# Patient Record
Sex: Male | Born: 1960 | Race: White | Hispanic: No | Marital: Married | Smoking: Never smoker
Health system: Southern US, Community
[De-identification: ages and names within clinical notes are randomized; demographics above are authoritative.]

## PROBLEM LIST (undated history)

## (undated) DIAGNOSIS — F4310 Post-traumatic stress disorder, unspecified: Secondary | ICD-10-CM

## (undated) DIAGNOSIS — Z87442 Personal history of urinary calculi: Secondary | ICD-10-CM

## (undated) DIAGNOSIS — R519 Headache, unspecified: Secondary | ICD-10-CM

## (undated) DIAGNOSIS — R011 Cardiac murmur, unspecified: Secondary | ICD-10-CM

## (undated) DIAGNOSIS — K219 Gastro-esophageal reflux disease without esophagitis: Secondary | ICD-10-CM

## (undated) DIAGNOSIS — R51 Headache: Secondary | ICD-10-CM

## (undated) HISTORY — PX: CLEFT PALATE REPAIR: SUR1165

## (undated) HISTORY — PX: HERNIA REPAIR: SHX51

## (undated) HISTORY — PX: KNEE ARTHROSCOPY: SUR90

## (undated) HISTORY — PX: CHOLECYSTECTOMY: SHX55

---

## 2010-11-09 ENCOUNTER — Ambulatory Visit (HOSPITAL_BASED_OUTPATIENT_CLINIC_OR_DEPARTMENT_OTHER)
Admission: RE | Admit: 2010-11-09 | Discharge: 2010-11-09 | Disposition: A | Discharge status: Home / Self Care | Payer: BC Managed Care – PPO | Source: Ambulatory Visit | Attending: Orthopedic Surgery | Admitting: Orthopedic Surgery

## 2010-11-09 DIAGNOSIS — M23359 Other meniscus derangements, posterior horn of lateral meniscus, unspecified knee: Secondary | ICD-10-CM | POA: Insufficient documentation

## 2010-11-09 DIAGNOSIS — K219 Gastro-esophageal reflux disease without esophagitis: Secondary | ICD-10-CM | POA: Insufficient documentation

## 2010-11-09 DIAGNOSIS — M2240 Chondromalacia patellae, unspecified knee: Secondary | ICD-10-CM | POA: Insufficient documentation

## 2010-11-09 DIAGNOSIS — M23329 Other meniscus derangements, posterior horn of medial meniscus, unspecified knee: Secondary | ICD-10-CM | POA: Insufficient documentation

## 2010-11-09 DIAGNOSIS — Z01812 Encounter for preprocedural laboratory examination: Secondary | ICD-10-CM | POA: Insufficient documentation

## 2010-11-09 LAB — POCT HEMOGLOBIN-HEMACUE: Hemoglobin: 17.1 g/dL — ABNORMAL HIGH (ref 13.0–17.0)

## 2010-11-10 NOTE — Op Note (Signed)
Benjamin Kirby, Benjamin Kirby NO.:  [id number]  MEDICAL RECORD NO.:  [id number]  LOCATION:                                 FACILITY:  PHYSICIAN:  Feliberto Gottron. Turner Daniels, M.D.        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  PROCEDURE:  Right knee partial arthroscopic medial and lateral meniscectomies, debridement of chondromalacia.  SURGEON:  Feliberto Gottron. Turner Daniels, MD  FIRST ASSISTANT:  None.  ANESTHETIC:  General LMA.  ESTIMATED BLOOD LOSS:  Minimal.  FLUID REPLACEMENT:  800 mL crystalloid.  DRAINS PLACED:  None.  TOURNIQUET TIME:  None.  INDICATIONS FOR PROCEDURE:  A 50 year old male with catching, popping and pain in his right knee.  He has failed conservative treatment, anti- inflammatory medicines, physical therapy, observation, cortisone injections and has an MRI scan showing intra-articular pathology.  He desires elective right knee arthroscopic evaluation treatment.  Risks and benefits of surgery discussed, questions answered.  DESCRIPTION OF PROCEDURE:  The patient was identified by armband and taken to the operating room 5 of the [facility] where the appropriate site monitors were attached and general LMA anesthesia induced with the patient in supine position.  Prior to going back to the operating room, he did have an intra-articular local anesthetic block placed.   After the successful induction of anesthesia, lateral post was applied to the table and the patient was then prepped and draped in the usual sterile fashion from the ankle to the midthigh.  Time-out procedure was performed and we began the operation itself by making standard inferomedial and anterolateral peripatellar portals allowing introduction of the arthroscope inferolateral portal outflow through the inferomedial portal pump pressure was set at a 100 mm.  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  This was debrided back to stable margin with a 3.5 gator sucker shaver.  The trial itself was clean as was the distal aspect of the medial and lateral femoral condyle.  The posterior aspect of the medial femoral condyle did have delamination of the cartilage and this was debrided back to a stable margin, grade 3 chondromalacia.  The posterior horn of the medial meniscus had a parrot-beak tear, this was resected with a small and large straight biters and a 3.5 gator sucker shaver.  The articular cartilage of the medial tibial plateau was in good condition. Moving into the notch, the ACL and PCL were intact.  On the lateral side, the patient had a posterior horn degenerative tearing of the lateral meniscus, debrided back to stable margin and a partial thickness horizontal tear that was also widely debrided.  The posterior aspect of the  lateral tibial plateau did have grade 3 and focal grade 4 chondromalacia.  Abrasion arthroplasty was performed using a 3.5 gator sucker shaver.  We also encountered some small cartilaginous loose bodies that were removed at this time as well.  The gutters were cleared medially and laterally.  The knee was irrigated out normal saline solution.  Arthroscopic instrument was removed.  Dressing of Xeroform, 4x4 dressing sponges, Webril and Ace wrap applied.  The patient was awakened, extubated and taken to the recovery room  without difficulty.     Feliberto Gottron. Turner Daniels, M.D.     Ovid Curd  D:  11/09/2010  T:  11/10/2010  Job:  [id number]  Electronically Signed by Gean Birchwood M.D. on 11/10/2010 10:44:57 PM

## 2011-06-07 ENCOUNTER — Ambulatory Visit (INDEPENDENT_AMBULATORY_CARE_PROVIDER_SITE_OTHER): Payer: Federal, State, Local not specified - PPO | Admitting: Family Medicine

## 2011-06-07 DIAGNOSIS — J209 Acute bronchitis, unspecified: Secondary | ICD-10-CM

## 2011-06-07 DIAGNOSIS — I4510 Unspecified right bundle-branch block: Secondary | ICD-10-CM

## 2011-06-07 DIAGNOSIS — J40 Bronchitis, not specified as acute or chronic: Secondary | ICD-10-CM

## 2011-06-07 DIAGNOSIS — Z0000 Encounter for general adult medical examination without abnormal findings: Secondary | ICD-10-CM

## 2011-06-07 DIAGNOSIS — J019 Acute sinusitis, unspecified: Secondary | ICD-10-CM

## 2011-06-07 MED ORDER — HYDROCODONE-HOMATROPINE 5-1.5 MG/5ML PO SYRP: 5.0000 mL | ORAL_SOLUTION | Freq: Four times a day (QID) | ORAL | Status: AC | PRN

## 2011-06-07 MED ORDER — LEVOFLOXACIN 500 MG PO TABS: 500.0000 mg | ORAL_TABLET | Freq: Every day | ORAL | Status: AC

## 2011-06-07 NOTE — Progress Notes (Signed)
Mr. Jon Billings is a 51 year old gentleman who works as a Curator in [employer]. He is currently suffering from a cough for 10 days along with sinus congestion and blocked ears. He's not had any fever nor significant shortness of breath.  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  Objective: HEENT is negative except for status post cleft palate repair scar, copious mucopurulent discharge from the nose, badly scarred TMs worse on the left. Oropharynx is clear. Neck shows no adenopathy or thyromegaly. He's in no acute distress his neck is supple. Chest reveals bilateral faint wheezing.  Spent 1/2 hour face to face reviewing PMHx  Assessment: Bronchitis and sinus sinusitis, acute. Duration has been over 10 days. Symptoms are worsening. Plan:  Plan: Levaquin and Hydromet.  If no better, patient may need a course of steroids.

## 2011-07-13 ENCOUNTER — Telehealth: Payer: Self-pay

## 2011-07-13 ENCOUNTER — Encounter: Payer: Self-pay | Admitting: Family Medicine

## 2011-07-13 NOTE — Telephone Encounter (Signed)
Dr L, can you write a letter for this pt to give to his work?

## 2011-07-13 NOTE — Telephone Encounter (Signed)
Notified pt that Dr L wrote the letter he needs for work. He stated he will p/up after work tomorrow.

## 2011-07-13 NOTE — Telephone Encounter (Signed)
I will write the letter.

## 2011-07-13 NOTE — Telephone Encounter (Signed)
Pt states he was seen by dr Milus Glazier in February 2013 and prescribed a cough med with codine in it.  He has now had to take a drug screen for his employer and it is possitive. Pt is requesting a medical note form dr Milus Glazier stating he prescribed the medicine.  Pt needs as soon as possible.  Please contact tact at [phone]

## 2015-04-16 ENCOUNTER — Encounter (HOSPITAL_COMMUNITY): Payer: Self-pay | Admitting: Emergency Medicine

## 2015-04-16 ENCOUNTER — Emergency Department (HOSPITAL_COMMUNITY): Payer: Managed Care, Other (non HMO)

## 2015-04-16 ENCOUNTER — Other Ambulatory Visit: Payer: Self-pay | Admitting: Emergency Medicine

## 2015-04-16 ENCOUNTER — Emergency Department (HOSPITAL_COMMUNITY)
Admission: EM | Admit: 2015-04-16 | Discharge: 2015-04-16 | Disposition: A | Discharge status: Home / Self Care | Payer: Managed Care, Other (non HMO) | Attending: Emergency Medicine | Admitting: Emergency Medicine

## 2015-04-16 DIAGNOSIS — R61 Generalized hyperhidrosis: Secondary | ICD-10-CM | POA: Insufficient documentation

## 2015-04-16 DIAGNOSIS — Z79899 Other long term (current) drug therapy: Secondary | ICD-10-CM | POA: Diagnosis not present

## 2015-04-16 DIAGNOSIS — N201 Calculus of ureter: Secondary | ICD-10-CM | POA: Diagnosis not present

## 2015-04-16 DIAGNOSIS — Z8679 Personal history of other diseases of the circulatory system: Secondary | ICD-10-CM | POA: Diagnosis not present

## 2015-04-16 DIAGNOSIS — R109 Unspecified abdominal pain: Secondary | ICD-10-CM | POA: Diagnosis present

## 2015-04-16 LAB — BASIC METABOLIC PANEL
Anion gap: 9 (ref 5–15)
BUN: 12 mg/dL (ref 6–20)
CO2: 22 mmol/L (ref 22–32)
Calcium: 9 mg/dL (ref 8.9–10.3)
Chloride: 103 mmol/L (ref 101–111)
Creatinine, Ser: 1.08 mg/dL (ref 0.61–1.24)
GFR calc Af Amer: >60 mL/min (ref >60)
GFR calc non Af Amer: >60 mL/min (ref >60)
Glucose, Bld: 128 mg/dL — ABNORMAL HIGH (ref 65–99)
Potassium: 3.7 mmol/L (ref 3.5–5.1)
Sodium: 134 mmol/L — ABNORMAL LOW (ref 135–145)

## 2015-04-16 LAB — URINALYSIS, ROUTINE W REFLEX MICROSCOPIC
Glucose, UA: NEGATIVE mg/dL
Ketones, ur: 15 mg/dL — AB
Nitrite: NEGATIVE
Protein, ur: 30 mg/dL — AB
Specific Gravity, Urine: 1.028 (ref 1.005–1.030)
pH: 5.5 (ref 5.0–8.0)

## 2015-04-16 LAB — CBC
HCT: 44.9 % (ref 39.0–52.0)
Hemoglobin: 16 g/dL (ref 13.0–17.0)
MCH: 29.9 pg (ref 26.0–34.0)
MCHC: 35.6 g/dL (ref 30.0–36.0)
MCV: 83.8 fL (ref 78.0–100.0)
Platelets: 249 10*3/uL (ref 150–400)
RBC: 5.36 MIL/uL (ref 4.22–5.81)
RDW: 12.8 % (ref 11.5–15.5)
WBC: 7.8 10*3/uL (ref 4.0–10.5)

## 2015-04-16 LAB — LIPASE, BLOOD: Lipase: 28 U/L (ref 11–51)

## 2015-04-16 LAB — DIFFERENTIAL
Basophils Absolute: 0.1 10*3/uL (ref 0.0–0.1)
Basophils Relative: 1 %
Eosinophils Absolute: 0.5 10*3/uL (ref 0.0–0.7)
Eosinophils Relative: 6 %
Lymphocytes Relative: 33 %
Lymphs Abs: 2.5 10*3/uL (ref 0.7–4.0)
Monocytes Absolute: 1.1 10*3/uL — ABNORMAL HIGH (ref 0.1–1.0)
Monocytes Relative: 14 %
Neutro Abs: 3.7 10*3/uL (ref 1.7–7.7)
Neutrophils Relative %: 46 %

## 2015-04-16 LAB — URINE MICROSCOPIC-ADD ON

## 2015-04-16 MED ORDER — ONDANSETRON HCL 4 MG/2ML IJ SOLN
4.0000 mg | Freq: Once | INTRAMUSCULAR | Status: AC
  Administered 2015-04-16: 4 mg via INTRAVENOUS
  Filled 2015-04-16: qty 2

## 2015-04-16 MED ORDER — HYDROMORPHONE HCL 1 MG/ML IJ SOLN
1.0000 mg | Freq: Once | INTRAMUSCULAR | Status: AC
  Administered 2015-04-16: 1 mg via INTRAVENOUS
  Filled 2015-04-16: qty 1

## 2015-04-16 MED ORDER — HYDROMORPHONE HCL 1 MG/ML IJ SOLN
0.5000 mg | Freq: Once | INTRAMUSCULAR | Status: AC
  Administered 2015-04-16: 0.5 mg via INTRAVENOUS
  Filled 2015-04-16: qty 1

## 2015-04-16 MED ORDER — ONDANSETRON HCL 4 MG PO TABS: 4.0000 mg | ORAL_TABLET | Freq: Four times a day (QID) | ORAL | Status: DC

## 2015-04-16 MED ORDER — HYDROCODONE-ACETAMINOPHEN 5-325 MG PO TABS: 1.0000 | ORAL_TABLET | ORAL | Status: DC | PRN

## 2015-04-16 MED ORDER — SODIUM CHLORIDE 0.9 % IV BOLUS (SEPSIS)
1000.0000 mL | Freq: Once | INTRAVENOUS | Status: AC
  Administered 2015-04-16: 1000 mL via INTRAVENOUS

## 2015-04-16 MED ORDER — KETOROLAC TROMETHAMINE 15 MG/ML IJ SOLN
15.0000 mg | Freq: Once | INTRAMUSCULAR | Status: AC
  Administered 2015-04-16: 15 mg via INTRAVENOUS
  Filled 2015-04-16: qty 1

## 2015-04-16 NOTE — ED Notes (Signed)
Dr. Kohut at bedside at this time.  

## 2015-04-16 NOTE — ED Notes (Signed)
Patient reports R flank pain which began at approx 2300. Rates pain 10/10. Sharp, constant pain along with nausea/vomiting.

## 2015-04-16 NOTE — Discharge Instructions (Signed)
Kidney Stones °Kidney stones (urolithiasis) are deposits that form inside your kidneys. The intense pain is caused by the stone moving through the urinary tract. When the stone moves, the ureter goes into spasm around the stone. The stone is usually passed in the urine.  °CAUSES  °· A disorder that makes certain neck glands produce too much parathyroid hormone (primary hyperparathyroidism). °· A buildup of uric acid crystals, similar to gout in your joints. °· Narrowing (stricture) of the ureter. °· A kidney obstruction present at birth (congenital obstruction). °· Previous surgery on the kidney or ureters. °· Numerous kidney infections. °SYMPTOMS  °· Feeling sick to your stomach (nauseous). °· Throwing up (vomiting). °· Blood in the urine (hematuria). °· Pain that usually spreads (radiates) to the groin. °· Frequency or urgency of urination. °DIAGNOSIS  °· Taking a history and physical exam. °· Blood or urine tests. °· CT scan. °· Occasionally, an examination of the inside of the urinary bladder (cystoscopy) is performed. °TREATMENT  °· Observation. °· Increasing your fluid intake. °· Extracorporeal shock wave lithotripsy--This is a noninvasive procedure that uses shock waves to break up kidney stones. °· Surgery may be needed if you have severe pain or persistent obstruction. There are various surgical procedures. Most of the procedures are performed with the use of small instruments. Only small incisions are needed to accommodate these instruments, so recovery time is minimized. °The size, location, and chemical composition are all important variables that will determine the proper choice of action for you. Talk to your health care provider to better understand your situation so that you will minimize the risk of injury to yourself and your kidney.  °HOME CARE INSTRUCTIONS  °· Drink enough water and fluids to keep your urine clear or pale yellow. This will help you to pass the stone or stone fragments. °· Strain  all urine through the provided strainer. Keep all particulate matter and stones for your health care provider to see. The stone causing the pain may be as small as a grain of salt. It is very important to use the strainer each and every time you pass your urine. The collection of your stone will allow your health care provider to analyze it and verify that a stone has actually passed. The stone analysis will often identify what you can do to reduce the incidence of recurrences. °· Only take over-the-counter or prescription medicines for pain, discomfort, or fever as directed by your health care provider. °· Keep all follow-up visits as told by your health care provider. This is important. °· Get follow-up X-rays if required. The absence of pain does not always mean that the stone has passed. It may have only stopped moving. If the urine remains completely obstructed, it can cause loss of kidney function or even complete destruction of the kidney. It is your responsibility to make sure X-rays and follow-ups are completed. Ultrasounds of the kidney can show blockages and the status of the kidney. Ultrasounds are not associated with any radiation and can be performed easily in a matter of minutes. °· Make changes to your daily diet as told by your health care provider. You may be told to: °¨ Limit the amount of salt that you eat. °¨ Eat 5 or more servings of fruits and vegetables each day. °¨ Limit the amount of meat, poultry, fish, and eggs that you eat. °· Collect a 24-hour urine sample as told by your health care provider. You may need to collect another urine sample every 6-12   months. °SEEK MEDICAL CARE IF: °· You experience pain that is progressive and unresponsive to any pain medicine you have been prescribed. °SEEK IMMEDIATE MEDICAL CARE IF:  °· Pain cannot be controlled with the prescribed medicine. °· You have a fever or shaking chills. °· The severity or intensity of pain increases over 18 hours and is not  relieved by pain medicine. °· You develop a new onset of abdominal pain. °· You feel faint or pass out. °· You are unable to urinate. °  °This information is not intended to replace advice given to you by your health care provider. Make sure you discuss any questions you have with your health care provider. °  °Document Released: 04/19/2005 Document Revised: 01/08/2015 Document Reviewed: 09/20/2012 °Elsevier Interactive Patient Education ©2016 [employer] ° °

## 2015-04-29 NOTE — ED Provider Notes (Signed)
CSN: [id number]     Arrival date & time 04/16/15  0039 History   None    Chief Complaint  Patient presents with  . Flank Pain     (Consider location/radiation/quality/duration/timing/severity/associated sxs/prior Treatment) HPI   54 year old male with right flank pain. Chewed onset around 11 PM tonight. Pain is been constant since onset. Describes a sharp. Radiates from her right mid back and right flank. No appreciable exacerbating relieving factors. Associated with nausea and vomited once. No fevers or chills. No urinary complaints. Denies past history kidney stones. No history similar type pain. Is status post cholecystectomy.  Past Medical History  Diagnosis Date  . RBBB     nl echo, Dr. Jacinto HalimGanji   Past Surgical History  Procedure Laterality Date  . Cleft palate repair      as child  . Knee arthroscopy      right   No family history on file. Social History  Substance Use Topics  . Smoking status: Never Smoker   . Smokeless tobacco: None  . Alcohol Use: None    Review of Systems  All systems reviewed and negative, other than as noted in HPI.   Allergies  Review of patient's allergies indicates no known allergies.  Home Medications   Prior to Admission medications   Medication Sig Start Date End Date Taking? Authorizing Provider  FLUoxetine (PROZAC) 10 MG capsule Take 10 mg by mouth daily.   Yes Historical Provider, MD  omeprazole (PRILOSEC) 20 MG capsule Take 20 mg by mouth daily.   Yes Historical Provider, MD  traZODone (DESYREL) 50 MG tablet Take 50 mg by mouth at bedtime.   Yes Historical Provider, MD  HYDROcodone-acetaminophen (NORCO/VICODIN) 5-325 MG tablet Take 1-2 tablets by mouth every 4 (four) hours as needed. 04/16/15   Raeford RazorStephen Sinda Leedom, MD  ondansetron (ZOFRAN) 4 MG tablet Take 1 tablet (4 mg total) by mouth every 6 (six) hours. 04/16/15   Raeford RazorStephen Marlee Trentman, MD   BP 128/66 mmHg  Pulse 85  Resp 14  SpO2 88% Physical Exam  Constitutional: He appears  well-developed and well-nourished. No distress.  Sitting up in bed. Diaphoretic and appears uncomfortable.  HENT:  Head: Normocephalic and atraumatic.  Eyes: Conjunctivae are normal. Right eye exhibits no discharge. Left eye exhibits no discharge.  Neck: Neck supple.  Cardiovascular: Normal rate, regular rhythm and normal heart sounds.  Exam reveals no gallop and no friction rub.   No murmur heard. Pulmonary/Chest: Effort normal and breath sounds normal. No respiratory distress.  Abdominal: Soft. He exhibits no distension. There is no tenderness.  Musculoskeletal: He exhibits no edema or tenderness.  Neurological: He is alert.  Skin: Skin is warm. He is diaphoretic.  Psychiatric: He has a normal mood and affect. His behavior is normal. Thought content normal.  Nursing note and vitals reviewed.   ED Course  Procedures (including critical care time) Labs Review Labs Reviewed - No data to display  Imaging Review No results found. I have personally reviewed and evaluated these images and lab results as part of my medical decision-making.   EKG Interpretation None      MDM   Final diagnoses:  Ureteral stone    54 year old male with right flank pain. Imaging significant for right ureteral stone. Symptoms now controlled. I feel appropriate for discharge at this time.    Raeford RazorStephen Docia Klar, MD 04/29/15 [id number]

## 2016-03-11 ENCOUNTER — Encounter (HOSPITAL_COMMUNITY): Payer: Self-pay | Admitting: Emergency Medicine

## 2016-03-11 ENCOUNTER — Emergency Department (HOSPITAL_COMMUNITY)
Admission: EM | Admit: 2016-03-11 | Discharge: 2016-03-11 | Disposition: A | Discharge status: Home / Self Care | Payer: Managed Care, Other (non HMO) | Attending: Emergency Medicine | Admitting: Emergency Medicine

## 2016-03-11 ENCOUNTER — Emergency Department (HOSPITAL_COMMUNITY): Payer: Managed Care, Other (non HMO)

## 2016-03-11 DIAGNOSIS — M542 Cervicalgia: Secondary | ICD-10-CM

## 2016-03-11 DIAGNOSIS — Y92410 Unspecified street and highway as the place of occurrence of the external cause: Secondary | ICD-10-CM | POA: Insufficient documentation

## 2016-03-11 DIAGNOSIS — S199XXA Unspecified injury of neck, initial encounter: Secondary | ICD-10-CM | POA: Insufficient documentation

## 2016-03-11 DIAGNOSIS — Y939 Activity, unspecified: Secondary | ICD-10-CM | POA: Insufficient documentation

## 2016-03-11 DIAGNOSIS — Y999 Unspecified external cause status: Secondary | ICD-10-CM | POA: Insufficient documentation

## 2016-03-11 HISTORY — DX: Post-traumatic stress disorder, unspecified: F43.10
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
## 2016-03-11 NOTE — ED Notes (Signed)
Pt is in stable condition upon d/c and ambulates from ED. 
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
## 2016-03-11 NOTE — Discharge Instructions (Signed)
Please read attached information. If you experience any new or worsening signs or symptoms please return to the emergency room for evaluation. Please follow-up with your primary care provider or specialist as discussed.  °

## 2017-04-25 ENCOUNTER — Other Ambulatory Visit: Payer: Self-pay

## 2017-04-25 ENCOUNTER — Emergency Department (HOSPITAL_COMMUNITY): Admission: EM | Admit: 2017-04-25 | Discharge: 2017-04-25 | Discharge status: Against Medical Advice | Payer: Self-pay

## 2017-04-26 ENCOUNTER — Emergency Department (HOSPITAL_COMMUNITY)
Admission: EM | Admit: 2017-04-26 | Discharge: 2017-04-26 | Disposition: A | Discharge status: Home / Self Care | Payer: 59 | Attending: Emergency Medicine | Admitting: Emergency Medicine

## 2017-04-26 ENCOUNTER — Encounter (HOSPITAL_COMMUNITY): Payer: Self-pay

## 2017-04-26 ENCOUNTER — Emergency Department (HOSPITAL_COMMUNITY): Payer: 59

## 2017-04-26 ENCOUNTER — Other Ambulatory Visit: Payer: Self-pay

## 2017-04-26 DIAGNOSIS — R112 Nausea with vomiting, unspecified: Secondary | ICD-10-CM | POA: Insufficient documentation

## 2017-04-26 DIAGNOSIS — Z79899 Other long term (current) drug therapy: Secondary | ICD-10-CM | POA: Diagnosis not present

## 2017-04-26 DIAGNOSIS — R109 Unspecified abdominal pain: Secondary | ICD-10-CM | POA: Diagnosis present

## 2017-04-26 DIAGNOSIS — N23 Unspecified renal colic: Secondary | ICD-10-CM | POA: Insufficient documentation

## 2017-04-26 LAB — I-STAT CHEM 8, ED
BUN: 21 mg/dL — ABNORMAL HIGH (ref 6–20)
CALCIUM ION: 1.17 mmol/L (ref 1.15–1.40)
CREATININE: 1.4 mg/dL — AB (ref 0.61–1.24)
Chloride: 105 mmol/L (ref 101–111)
Glucose, Bld: 113 mg/dL — ABNORMAL HIGH (ref 65–99)
HCT: 47 % (ref 39.0–52.0)
HEMOGLOBIN: 16 g/dL (ref 13.0–17.0)
Potassium: 4.1 mmol/L (ref 3.5–5.1)
SODIUM: 140 mmol/L (ref 135–145)
TCO2: 22 mmol/L (ref 22–32)

## 2017-04-26 LAB — URINALYSIS, ROUTINE W REFLEX MICROSCOPIC
BILIRUBIN URINE: NEGATIVE
Bacteria, UA: NONE SEEN
Glucose, UA: NEGATIVE mg/dL
Ketones, ur: NEGATIVE mg/dL
LEUKOCYTES UA: NEGATIVE
Nitrite: NEGATIVE
PH: 5 (ref 5.0–8.0)
Protein, ur: NEGATIVE mg/dL
SPECIFIC GRAVITY, URINE: 1.029 (ref 1.005–1.030)
SQUAMOUS EPITHELIAL / LPF: NONE SEEN

## 2017-04-26 MED ORDER — TAMSULOSIN HCL 0.4 MG PO CAPS: 0.4000 mg | ORAL_CAPSULE | Freq: Every day | ORAL | 0 refills | Status: DC

## 2017-04-26 MED ORDER — ONDANSETRON HCL 4 MG PO TABS: 4.0000 mg | ORAL_TABLET | Freq: Four times a day (QID) | ORAL | 0 refills | Status: DC | PRN

## 2017-04-26 MED ORDER — ONDANSETRON HCL 4 MG/2ML IJ SOLN
4.0000 mg | Freq: Once | INTRAMUSCULAR | Status: AC
  Administered 2017-04-26: 4 mg via INTRAVENOUS
  Filled 2017-04-26: qty 2

## 2017-04-26 MED ORDER — KETOROLAC TROMETHAMINE 30 MG/ML IJ SOLN
30.0000 mg | Freq: Once | INTRAMUSCULAR | Status: AC
  Administered 2017-04-26: 30 mg via INTRAVENOUS
  Filled 2017-04-26: qty 1

## 2017-04-26 MED ORDER — HYDROMORPHONE HCL 1 MG/ML IJ SOLN
1.0000 mg | Freq: Once | INTRAMUSCULAR | Status: AC
  Administered 2017-04-26: 1 mg via INTRAVENOUS
  Filled 2017-04-26: qty 1

## 2017-04-26 MED ORDER — SODIUM CHLORIDE 0.9 % IV BOLUS (SEPSIS)
500.0000 mL | Freq: Once | INTRAVENOUS | Status: AC
  Administered 2017-04-26: 500 mL via INTRAVENOUS

## 2017-04-26 MED ORDER — OXYCODONE-ACETAMINOPHEN 5-325 MG PO TABS: 1.0000 | ORAL_TABLET | ORAL | 0 refills | Status: DC | PRN

## 2017-04-26 NOTE — ED Notes (Signed)
Pt aware of need for urine  

## 2017-04-26 NOTE — ED Notes (Signed)
ED Provider at bedside. 

## 2017-04-26 NOTE — ED Triage Notes (Signed)
Pt from home with right flank pain that has been going on for a day. Pt also endorses vomiting when the pain get worse. Pt has history of kidney stones and this feels similar

## 2017-04-26 NOTE — ED Notes (Signed)
Patient transported to Ultrasound 

## 2017-04-26 NOTE — ED Provider Notes (Signed)
[facility]   CSN: [id number] Arrival date & time: 04/26/17  [id number]     History   Chief Complaint Chief Complaint  Patient presents with  . Flank Pain    right    HPI Benjamin Kirby is a 56 y.o. male.  HPI Patient with history of renal stones presents with right-sided flank pain radiating to the right abdomen starting yesterday at 4 PM.  Associated with nausea and multiple episodes of vomiting.  No diarrhea constipation.  Patient does have some hesitancy with urination but denies any gross hematuria.  No fever or chills.  States pain is very similar to previous kidney stone. Past Medical History:  Diagnosis Date  . PTSD (post-traumatic stress disorder)   . RBBB    nl echo, Dr. Jacinto HalimGanji    Patient Active Problem List   Diagnosis Date Noted  . Healthcare maintenance 06/07/2011  . RBBB 06/07/2011    Past Surgical History:  Procedure Laterality Date  . CLEFT PALATE REPAIR     as child  . KNEE ARTHROSCOPY     right       Home Medications    Prior to Admission medications   Medication Sig Start Date End Date Taking? Authorizing Provider  FLUoxetine (PROZAC) 10 MG capsule Take 20 mg by mouth daily.    Yes [provider]  omeprazole (PRILOSEC) 20 MG capsule Take 20 mg by mouth daily.   Yes [provider]  prazosin (MINIPRESS) 2 MG capsule Take 2 mg by mouth at bedtime.   Yes [provider]  SUMAtriptan (IMITREX) 100 MG tablet Take 100 mg by mouth daily as needed for migraine.  04/06/17  Yes [provider]  traZODone (DESYREL) 50 MG tablet Take 50 mg by mouth at bedtime.   Yes [provider]  ondansetron (ZOFRAN) 4 MG tablet Take 1 tablet (4 mg total) by mouth every 6 (six) hours as needed for nausea or vomiting. 04/26/17   Loren RacerYelverton, Kiala Faraj, MD  oxyCODONE-acetaminophen (PERCOCET) 5-325 MG tablet Take 1-2 tablets by mouth every 4 (four) hours as needed for severe pain.  04/26/17   Loren RacerYelverton, Mannie Ohlin, MD  tamsulosin (FLOMAX) 0.4 MG CAPS capsule Take 1 capsule (0.4 mg total) by mouth daily. 04/26/17   Loren RacerYelverton, Letisia Schwalb, MD    Family History History reviewed. No pertinent family history.  Social History Social History   Tobacco Use  . Smoking status: Never Smoker  . Smokeless tobacco: Never Used  Substance Use Topics  . Alcohol use: Yes    Comment: rare  . Drug use: No     Allergies   Patient has no known allergies.   Review of Systems Review of Systems  Constitutional: Negative for chills and fever.  Respiratory: Negative for shortness of breath.   Cardiovascular: Negative for chest pain.  Gastrointestinal: Positive for abdominal pain, nausea and vomiting. Negative for constipation and diarrhea.  Genitourinary: Positive for difficulty urinating and flank pain. Negative for dysuria, frequency, hematuria and testicular pain.  Musculoskeletal: Positive for back pain.  Skin: Negative for rash and wound.  Neurological: Negative for weakness and numbness.  All other systems reviewed and are negative.    Physical Exam Updated Vital Signs BP 113/74   Pulse 65   Temp 97.6 F (36.4 C) (Oral)   Resp 16   Ht 6' (1.829 m)   Wt 102.1 kg (225 lb)   SpO2 95%   BMI 30.52 kg/m   Physical Exam  Constitutional:  He is oriented to person, place, and time. He appears well-developed and well-nourished.  Appears uncomfortable  HENT:  Head: Normocephalic and atraumatic.  Mouth/Throat: Oropharynx is clear and moist.  Eyes: EOM are normal. Pupils are equal, round, and reactive to light.  Neck: Normal range of motion. Neck supple.  Cardiovascular: Normal rate and regular rhythm.  Pulmonary/Chest: Effort normal and breath sounds normal.  Abdominal: Soft. Bowel sounds are normal. There is tenderness. There is no rebound and no guarding.  Mild right-sided abdominal tenderness without rebound or guarding.  Musculoskeletal: Normal range of motion. He  exhibits tenderness. He exhibits no edema.  Mild right CVA tenderness to percussion.  Neurological: He is alert and oriented to person, place, and time.  Moves all extremities without deficit.  Sensation fully intact.  Skin: Skin is warm and dry. Capillary refill takes less than 2 seconds. No rash noted. No erythema.  Psychiatric: He has a normal mood and affect. His behavior is normal.  Nursing note and vitals reviewed.    ED Treatments / Results  Labs (all labs ordered are listed, but only abnormal results are displayed) Labs Reviewed  URINALYSIS, ROUTINE W REFLEX MICROSCOPIC - Abnormal; Notable for the following components:      Result Value   Hgb urine dipstick LARGE (*)    All other components within normal limits  I-STAT CHEM 8, ED - Abnormal; Notable for the following components:   BUN 21 (*)    Creatinine, Ser 1.40 (*)    Glucose, Bld 113 (*)    All other components within normal limits    EKG  EKG Interpretation None       Radiology US Renal  Result Date: 04/26/2017 CLINICAL DATA:  Right flank pain. EXAM: RENAL / URINARY TRACT ULTRASOUND COMPLETE COMPARISON:  CT 04/16/2015 FINDINGS: Right Kidney: Length: 12.8 cm. Echogenicity within normal limits. No mass or hydronephrosis visualized. Left Kidney: Length: 12.9 cm. Echogenicity within normal limits. No mass or hydronephrosis visualized. Bladder: Bladder predominately decompressed. IMPRESSION: Normal renal ultrasound. Electronically Signed   By: Elberta Fortis M.D.   On: 04/26/2017 09:36    Procedures Procedures (including critical care time)  Medications Ordered in ED Medications  HYDROmorphone (DILAUDID) injection 1 mg (1 mg Intravenous Given 04/26/17 0906)  ketorolac (TORADOL) 30 MG/ML injection 30 mg (30 mg Intravenous Given 04/26/17 0905)  ondansetron (ZOFRAN) injection 4 mg (4 mg Intravenous Given 04/26/17 0904)  sodium chloride 0.9 % bolus 500 mL (0 mLs Intravenous Stopped 04/26/17 1039)     Initial  Impression / Assessment and Plan / ED Course  I have reviewed the triage vital signs and the nursing notes.  Pertinent labs & imaging results that were available during my care of the patient were reviewed by me and considered in my medical decision making (see chart for details).     Patient is currently asymptomatic.  Mild elevation in creatinine which may be related to dehydration.  Have given urine strainer and will start on Sinemet treatment.  Advised to follow-up with urology. Final Clinical Impressions(s) / ED Diagnoses   Final diagnoses:  Ureteral colic    ED Discharge Orders        Ordered    oxyCODONE-acetaminophen (PERCOCET) 5-325 MG tablet  Every 4 hours PRN     04/26/17 1141    ondansetron (ZOFRAN) 4 MG tablet  Every 6 hours PRN     04/26/17 1141    tamsulosin (FLOMAX) 0.4 MG CAPS capsule  Daily     04/26/17  1148       Loren RacerYelverton, Caleen Taaffe, MD 04/26/17 [phone]

## 2017-04-26 NOTE — ED Notes (Signed)
Incorrect respirations entered at 0841 respirations were 16 not 116

## 2017-04-28 ENCOUNTER — Encounter (HOSPITAL_COMMUNITY): Payer: Self-pay | Admitting: General Practice

## 2017-04-29 ENCOUNTER — Other Ambulatory Visit: Payer: Self-pay | Admitting: Urology

## 2017-05-02 ENCOUNTER — Ambulatory Visit (HOSPITAL_COMMUNITY)
Admission: RE | Admit: 2017-05-02 | Discharge: 2017-05-02 | Disposition: A | Discharge status: Home / Self Care | Payer: 59 | Source: Ambulatory Visit | Attending: Urology | Admitting: Urology

## 2017-05-02 ENCOUNTER — Encounter (HOSPITAL_COMMUNITY): Payer: Self-pay | Admitting: *Deleted

## 2017-05-02 ENCOUNTER — Encounter (HOSPITAL_COMMUNITY)
Admission: RE | Disposition: A | Discharge status: Home / Self Care | Payer: Self-pay | Source: Ambulatory Visit | Attending: Urology

## 2017-05-02 ENCOUNTER — Ambulatory Visit (HOSPITAL_COMMUNITY): Payer: 59

## 2017-05-02 DIAGNOSIS — K219 Gastro-esophageal reflux disease without esophagitis: Secondary | ICD-10-CM | POA: Insufficient documentation

## 2017-05-02 DIAGNOSIS — Z79899 Other long term (current) drug therapy: Secondary | ICD-10-CM | POA: Diagnosis not present

## 2017-05-02 DIAGNOSIS — N202 Calculus of kidney with calculus of ureter: Secondary | ICD-10-CM | POA: Diagnosis not present

## 2017-05-02 DIAGNOSIS — Z87442 Personal history of urinary calculi: Secondary | ICD-10-CM | POA: Insufficient documentation

## 2017-05-02 DIAGNOSIS — F4310 Post-traumatic stress disorder, unspecified: Secondary | ICD-10-CM | POA: Insufficient documentation

## 2017-05-02 DIAGNOSIS — N201 Calculus of ureter: Secondary | ICD-10-CM

## 2017-05-02 HISTORY — PX: EXTRACORPOREAL SHOCK WAVE LITHOTRIPSY: SHX1557

## 2017-05-02 HISTORY — DX: Personal history of urinary calculi: Z87.442

## 2017-05-02 HISTORY — DX: Gastro-esophageal reflux disease without esophagitis: K21.9

## 2017-05-02 HISTORY — DX: Cardiac murmur, unspecified: R01.1

## 2017-05-02 HISTORY — DX: Headache, unspecified: R51.9

## 2017-05-02 HISTORY — DX: Headache: R51

## 2017-05-02 SURGERY — LITHOTRIPSY, ESWL
Anesthesia: LOCAL | Laterality: Right

## 2017-05-02 MED ORDER — SODIUM CHLORIDE 0.9 % IV SOLN
INTRAVENOUS | Status: DC
  Administered 2017-05-02: 08:00:00 via INTRAVENOUS

## 2017-05-02 MED ORDER — DIAZEPAM 5 MG PO TABS
10.0000 mg | ORAL_TABLET | ORAL | Status: AC
  Administered 2017-05-02: 10 mg via ORAL
  Filled 2017-05-02: qty 2

## 2017-05-02 MED ORDER — CIPROFLOXACIN HCL 500 MG PO TABS
500.0000 mg | ORAL_TABLET | ORAL | Status: AC
  Administered 2017-05-02: 500 mg via ORAL
  Filled 2017-05-02: qty 1

## 2017-05-02 MED ORDER — DIPHENHYDRAMINE HCL 25 MG PO CAPS
25.0000 mg | ORAL_CAPSULE | ORAL | Status: AC
  Administered 2017-05-02: 25 mg via ORAL
  Filled 2017-05-02: qty 1

## 2017-05-02 NOTE — Discharge Instructions (Signed)
Moderate Conscious Sedation, Adult, Care After °These instructions provide you with information about caring for yourself after your procedure. Your health care provider may also give you more specific instructions. Your treatment has been planned according to current medical practices, but problems sometimes occur. Call your health care provider if you have any problems or questions after your procedure. °What can I expect after the procedure? °After your procedure, it is common: °To feel sleepy for several hours. °To feel clumsy and have poor balance for several hours. °To have poor judgment for several hours. °To vomit if you eat too soon. ° °Follow these instructions at home: °For at least 24 hours after the procedure: ° °Do not: °Participate in activities where you could fall or become injured. °Drive. °Use heavy machinery. °Drink alcohol. °Take sleeping pills or medicines that cause drowsiness. °Make important decisions or sign legal documents. °Take care of children on your own. °Rest. °Eating and drinking °Follow the diet recommended by your health care provider. °If you vomit: °Drink water, juice, or soup when you can drink without vomiting. °Make sure you have little or no nausea before eating solid foods. °General instructions °Have a responsible adult stay with you until you are awake and alert. °Take over-the-counter and prescription medicines only as told by your health care provider. °If you smoke, do not smoke without supervision. °Keep all follow-up visits as told by your health care provider. This is important. °Contact a health care provider if: °You keep feeling nauseous or you keep vomiting. °You feel light-headed. °You develop a rash. °You have a fever. °Get help right away if: °You have trouble breathing. °This information is not intended to replace advice given to you by your health care provider. Make sure you discuss any questions you have with your health care provider. °Document Released:  02/07/2013 Document Revised: 09/22/2015 Document Reviewed: 08/09/2015 °Elsevier Interactive Patient Education © 2018 [employer] °Lithotripsy, Care After °This sheet gives you information about how to care for yourself after your procedure. Your health care provider may also give you more specific instructions. If you have problems or questions, contact your health care provider. °What can I expect after the procedure? °After the procedure, it is common to have: °· Some blood in your urine. This should only last for a few days. °· Soreness in your back, sides, or upper abdomen for a few days. °· Blotches or bruises on your back where the pressure wave entered the skin. °· Pain, discomfort, or nausea when pieces (fragments) of the kidney stone move through the tube that carries urine from the kidney to the bladder (ureter). Stone fragments may pass soon after the procedure, but they may continue to pass for up to 4-8 weeks. °? If you have severe pain or nausea, contact your health care provider. This may be caused by a large stone that was not broken up, and this may mean that you need more treatment. °· Some pain or discomfort during urination. °· Some pain or discomfort in the lower abdomen or (in men) at the base of the penis. ° °Follow these instructions at home: °Medicines °· Take over-the-counter and prescription medicines only as told by your health care provider. °· If you were prescribed an antibiotic medicine, take it as told by your health care provider. Do not stop taking the antibiotic even if you start to feel better. °· Do not drive for 24 hours if you were given a medicine to help you relax (sedative). °· Do not drive   or use heavy machinery while taking prescription pain medicine. °Eating and drinking °· Drink enough water and fluids to keep your urine clear or pale yellow. This helps any remaining pieces of the stone to pass. It can also help prevent new stones from forming. °· Eat plenty of fresh  fruits and vegetables. °· Follow instructions from your health care provider about eating and drinking restrictions. You may be instructed: °? To reduce how much salt (sodium) you eat or drink. Check ingredients and nutrition facts on packaged foods and beverages. °? To reduce how much meat you eat. °· Eat the recommended amount of calcium for your age and gender. Ask your health care provider how much calcium you should have. °General instructions °· Get plenty of rest. °· Most people can resume normal activities 1-2 days after the procedure. Ask your health care provider what activities are safe for you. °· If directed, strain all urine through the strainer that was provided by your health care provider. °? Keep all fragments for your health care provider to see. Any stones that are found may be sent to a medical lab for examination. The stone may be as small as a grain of salt. °· Keep all follow-up visits as told by your health care provider. This is important. °Contact a health care provider if: °· You have pain that is severe or does not get better with medicine. °· You have nausea that is severe or does not go away. °· You have blood in your urine longer than your health care provider told you to expect. °· You have more blood in your urine. °· You have pain during urination that does not go away. °· You urinate more frequently than usual and this does not go away. °· You develop a rash or any other possible signs of an allergic reaction. °Get help right away if: °· You have severe pain in your back, sides, or upper abdomen. °· You have severe pain while urinating. °· Your urine is very dark red. °· You have blood in your stool (feces). °· You cannot pass any urine at all. °· You feel a strong urge to urinate after emptying your bladder. °· You have a fever or chills. °· You develop shortness of breath, difficulty breathing, or chest pain. °· You have severe nausea that leads to persistent vomiting. °· You  faint. °Summary °· After this procedure, it is common to have some pain, discomfort, or nausea when pieces (fragments) of the kidney stone move through the tube that carries urine from the kidney to the bladder (ureter). If this pain or nausea is severe, however, you should contact your health care provider. °· Most people can resume normal activities 1-2 days after the procedure. Ask your health care provider what activities are safe for you. °· Drink enough water and fluids to keep your urine clear or pale yellow. This helps any remaining pieces of the stone to pass, and it can help prevent new stones from forming. °· If directed, strain your urine and keep all fragments for your health care provider to see. Fragments or stones may be as small as a grain of salt. °· Get help right away if you have severe pain in your back, sides, or upper abdomen or have severe pain while urinating. °This information is not intended to replace advice given to you by your health care provider. Make sure you discuss any questions you have with your health care provider. °Document Released: 05/09/2007 Document Revised:   03/10/2016 Document Reviewed: 03/10/2016 °Elsevier Interactive Patient Education © 2018 [employer] ° °

## 2017-05-02 NOTE — Op Note (Signed)
See [facility] OP note scanned into chart. Also because of the size, density, location and other factors that cannot be anticipated I feel this will likely be a staged procedure. This fact supersedes any indication in the scanned [facility] operative note to the contrary.  

## 2017-05-02 NOTE — H&P (Signed)
Urology Preoperative H&P   Chief Complaint: Right flank pain  History of Present Illness: Benjamin DagoRicky L Paulo is a 56 y.o. male with a 7 mm right mid-ureteral stone.  He initially presented to the ED on 04/25/17 secondary to worsening right flank pain that migrated to his inguinal region.  He had an RUS at that time that was negative.  He presented to Dr. Shannan HarperBell's office on 04/28/17 and had a CTSS that demonstrated an obstructing right ureteral stone with mild hydronephrosis.  He has a history of stones and has passed them in the past without any complications.  He is voiding without any difficulty and denies dysuria, hematuria, fever/chills or nausea/vomiting.      Past Medical History:  Diagnosis Date  . GERD (gastroesophageal reflux disease)   . Headache    migraines   . Heart murmur    history of heart murmur, resolved now  . History of kidney stones   . PTSD (post-traumatic stress disorder)    PTSD    Past Surgical History:  Procedure Laterality Date  . CHOLECYSTECTOMY     in the 80's  . CLEFT PALATE REPAIR     as child  . HERNIA REPAIR     double in the 80's  . KNEE ARTHROSCOPY     right    Allergies: No Known Allergies  History reviewed. No pertinent family history.  Social History:  reports that  has never smoked. he has never used smokeless tobacco. He reports that he drinks alcohol. He reports that he does not use drugs.  ROS: A complete review of systems was performed.  All systems are negative except for pertinent findings as noted.  Physical Exam:  Vital signs in last 24 hours: Temp:  [97.5 F (36.4 C)] 97.5 F (36.4 C) (12/31 0636) Pulse Rate:  [79] 79 (12/31 0636) Resp:  [16] 16 (12/31 0636) BP: (130)/(78) 130/78 (12/31 0636) SpO2:  [96 %] 96 % (12/31 0636) Weight:  [103.5 kg (228 lb 4 oz)] 103.5 kg (228 lb 4 oz) (12/31 0636) Constitutional:  Alert and oriented, No acute distress Cardiovascular: Regular rate and rhythm, No JVD Respiratory: Normal  respiratory effort, Lungs clear bilaterally GI: Abdomen is soft, nontender, nondistended, no abdominal masses GU: No CVA tenderness Lymphatic: No lymphadenopathy Neurologic: Grossly intact, no focal deficits Psychiatric: Normal mood and affect  Laboratory Data:  No results for input(s): WBC, HGB, HCT, PLT in the last 72 hours.  No results for input(s): NA, K, CL, GLUCOSE, BUN, CALCIUM, CREATININE in the last 72 hours.  Invalid input(s): CO3   No results found for this or any previous visit (from the past 24 hour(s)). No results found for this or any previous visit (from the past 240 hour(s)).  Renal Function: Recent Labs    04/26/17 0857  CREATININE 1.40*   Estimated Creatinine Clearance: 72.5 mL/min (A) (by C-G formula based on SCr of 1.4 mg/dL (H)).  Radiologic Imaging: Dg Abd 1 View  Result Date: 05/02/2017 CLINICAL DATA:  Preoperative examination prior to lithotripsy on the right. The patient ports flank pain this morning. EXAM: ABDOMEN - 1 VIEW COMPARISON:  KUB dated April 28, 2017. FINDINGS: There is a persistent approximately 4 x 6 mm stone projecting in the region of the UPJ or proximal right ureter. There is a stable 2 mm diameter upper pole stone on the left. There surgical clips in the right mid abdomen and in the right aspect of the pelvis. The bowel gas pattern is normal. The  bony structures are unremarkable. IMPRESSION: Stable appearance of the bilateral kidney stones. Electronically Signed   By: David  Swaziland[place] M.D.   On: 05/02/2017 07:34    I independently reviewed the above imaging studies.  Assessment and Plan Benjamin Kirby is a 56 y.o. male with an obstructing right mid-ureteral stone visible on CTSS and KUB  -The risks, benefits and alternatives of right ESWL was discussed with the patient.  He voices understanding and wishes to proceed.  Rhoderick Moodyhristopher Romana Deaton, MD 05/02/2017, 7:39 AM  [facility] Pager: [phone]

## 2018-01-15 ENCOUNTER — Encounter (HOSPITAL_COMMUNITY): Payer: Self-pay

## 2018-01-15 ENCOUNTER — Emergency Department (HOSPITAL_COMMUNITY): Payer: 59

## 2018-01-15 ENCOUNTER — Other Ambulatory Visit: Payer: Self-pay

## 2018-01-15 ENCOUNTER — Emergency Department (HOSPITAL_COMMUNITY)
Admission: EM | Admit: 2018-01-15 | Discharge: 2018-01-15 | Disposition: A | Discharge status: Home / Self Care | Payer: 59 | Attending: Emergency Medicine | Admitting: Emergency Medicine

## 2018-01-15 DIAGNOSIS — R7989 Other specified abnormal findings of blood chemistry: Secondary | ICD-10-CM

## 2018-01-15 DIAGNOSIS — Z79899 Other long term (current) drug therapy: Secondary | ICD-10-CM | POA: Insufficient documentation

## 2018-01-15 DIAGNOSIS — N50812 Left testicular pain: Secondary | ICD-10-CM | POA: Diagnosis not present

## 2018-01-15 DIAGNOSIS — R1084 Generalized abdominal pain: Secondary | ICD-10-CM | POA: Diagnosis present

## 2018-01-15 DIAGNOSIS — N200 Calculus of kidney: Secondary | ICD-10-CM | POA: Diagnosis not present

## 2018-01-15 LAB — CBC WITH DIFFERENTIAL/PLATELET
BASOS PCT: 0 %
Basophils Absolute: 0 10*3/uL (ref 0.0–0.1)
EOS PCT: 4 %
Eosinophils Absolute: 0.3 10*3/uL (ref 0.0–0.7)
HCT: 41.7 % (ref 39.0–52.0)
HEMOGLOBIN: 14.7 g/dL (ref 13.0–17.0)
LYMPHS ABS: 1.1 10*3/uL (ref 0.7–4.0)
Lymphocytes Relative: 14 %
MCH: 29.3 pg (ref 26.0–34.0)
MCHC: 35.3 g/dL (ref 30.0–36.0)
MCV: 83.1 fL (ref 78.0–100.0)
MONOS PCT: 12 %
Monocytes Absolute: 0.9 10*3/uL (ref 0.1–1.0)
NEUTROS PCT: 70 %
Neutro Abs: 5.6 10*3/uL (ref 1.7–7.7)
PLATELETS: 201 10*3/uL (ref 150–400)
RBC: 5.02 MIL/uL (ref 4.22–5.81)
RDW: 12.9 % (ref 11.5–15.5)
WBC: 8 10*3/uL (ref 4.0–10.5)

## 2018-01-15 LAB — URINALYSIS, ROUTINE W REFLEX MICROSCOPIC
BACTERIA UA: NONE SEEN
Bilirubin Urine: NEGATIVE
GLUCOSE, UA: NEGATIVE mg/dL
KETONES UR: NEGATIVE mg/dL
Leukocytes, UA: NEGATIVE
NITRITE: NEGATIVE
Protein, ur: NEGATIVE mg/dL
SPECIFIC GRAVITY, URINE: 1.008 (ref 1.005–1.030)
pH: 6 (ref 5.0–8.0)

## 2018-01-15 LAB — BASIC METABOLIC PANEL
Anion gap: 10 (ref 5–15)
BUN: 17 mg/dL (ref 6–20)
CHLORIDE: 105 mmol/L (ref 98–111)
CO2: 22 mmol/L (ref 22–32)
Calcium: 9.2 mg/dL (ref 8.9–10.3)
Creatinine, Ser: 1.65 mg/dL — ABNORMAL HIGH (ref 0.61–1.24)
GFR calc non Af Amer: 44 mL/min — ABNORMAL LOW (ref >60)
GFR, EST AFRICAN AMERICAN: 51 mL/min — AB (ref >60)
Glucose, Bld: 119 mg/dL — ABNORMAL HIGH (ref 70–99)
POTASSIUM: 4.1 mmol/L (ref 3.5–5.1)
SODIUM: 137 mmol/L (ref 135–145)

## 2018-01-15 MED ORDER — OXYCODONE-ACETAMINOPHEN 5-325 MG PO TABS: 1.0000 | ORAL_TABLET | ORAL | 0 refills | Status: AC | PRN

## 2018-01-15 MED ORDER — ONDANSETRON HCL 4 MG/2ML IJ SOLN
4.0000 mg | Freq: Once | INTRAMUSCULAR | Status: AC
  Administered 2018-01-15: 4 mg via INTRAVENOUS
  Filled 2018-01-15: qty 2

## 2018-01-15 MED ORDER — TAMSULOSIN HCL 0.4 MG PO CAPS: 0.4000 mg | ORAL_CAPSULE | Freq: Every day | ORAL | 0 refills | Status: AC

## 2018-01-15 MED ORDER — SODIUM CHLORIDE 0.9 % IV BOLUS: 250.0000 mL | Freq: Once | INTRAVENOUS | Status: DC

## 2018-01-15 MED ORDER — ONDANSETRON HCL 4 MG PO TABS: 4.0000 mg | ORAL_TABLET | Freq: Three times a day (TID) | ORAL | 0 refills | Status: AC | PRN

## 2018-01-15 MED ORDER — MORPHINE SULFATE (PF) 4 MG/ML IV SOLN
4.0000 mg | Freq: Once | INTRAVENOUS | Status: AC
Start: 2018-01-15 — End: 2018-01-15
  Administered 2018-01-15: 4 mg via INTRAVENOUS
  Filled 2018-01-15: qty 1

## 2018-01-15 NOTE — ED Triage Notes (Addendum)
Pt reports L flank pain that started on Monday. He went to the Urologist and they saw a stone in his urethra. He doesn't think he has passed it, but his pain has persisted and increased. He reports nausea as well. Denies dysuria or hematuria. A&Ox4. Ambulatory. Hx of kidney stones

## 2018-01-15 NOTE — Discharge Instructions (Addendum)
Please return to the Emergency Department for any new or worsening symptoms or if your symptoms do not improve. Please be sure to follow up with your Primary Care Physician as soon as possible regarding your visit today. If you do not have a Primary Doctor please use the resources below to establish one. Your CT scan today showed 2 kidney stones, one in your left ureter and the second in your right kidney.  Please use the medications prescribed to help facilitate stone passage.  It is likely that your left testicle pain is referred pain from your left kidney stone.  Your ultrasound today was reassuring.  Please follow-up with your urologist as soon as possible for further evaluation of your kidney stones and to ensure that your left testicular pain has improved. You may use the Percocet as prescribed for severe pain.  Please do not drive or operate machine-year-old taking this medication. You may use the Flomax as prescribed to facilitate stone passage. You may use the Zofran as prescribed to help with nausea. Your creatinine which is a measure of kidney function was elevated today.  It is likely that this is due to your stone however it is important to follow-up with your primary care doctor and your urologist for follow-up testing in the future.  Contact a doctor if: You have pain that gets worse or does not get better with medicine. Get help right away if: You have a fever or chills. You get very bad pain. You get new pain in your belly (abdomen). You pass out (faint). You cannot pee.    RESOURCE GUIDE  Chronic Pain Problems: Contact [facility]  [phone] Patients need to be referred by their primary care doctor.  Insufficient Money for Medicine: Contact [employer]:  call "211" or [facility] [phone].  No Primary Care Doctor: Call [facility]  [phone] - can help you locate a primary care doctor that  accepts your insurance, provides certain services,  etc. Physician Referral Service- [phone]  Agencies that provide inexpensive medical care: [facility]  [phone] [facility]  [phone] [facility]  [phone] [facility]  [phone] [facility]  [phone] [facility]  [phone]  Medicaid-accepting [facility] Providers: [facility]- [address], [address]  [phone], Mon-Fri 9am-7pm, Sat 9am-1pm [facility]- [address], [address] [place]  [phone] [facility]- [address], [address] [place]  [phone] [facility]- [address]  [phone] Renaye RakersVeita Bland- [address], [address], [phone]  Only accepts [place] patients after they have their name  applied to their card  Self Pay (no insurance) in [facility]: Sickle Cell Patients: Dr Willey BladeEric Dean, [facility]  [address], [phone] [facility]- [address]  [phone]       Redge Gainer-     [facility]- [address]       -     [facility]- see information above (Speak to CitigroupPam H if you do not have insurance)       -  [facility]- [address], [phone]       -  [facility]- [address],  [phone]       -  [facility]- [address], [phone]       -  Dr Vista Lawman-  [address], [address], [place], [facility]- [address], [phone]       -  [facility]- [address], [place], also [address] [phone]       -    [facility]- [address], [place], 1st & 3rd Saturday   every month, 10am-1pm  1) Find a Doctor and Pay Out of Pocket Although you won't have to find out who is covered by your insurance plan, it is a good idea to ask around and get recommendations. You will then need to call the  office and see if the doctor you have chosen will accept you as a new patient and what types of options they offer for patients who are self-pay. Some doctors offer discounts or will set up payment plans for their patients who do not have insurance, but you will need to ask so you aren't surprised when you get to your appointment.  2) Contact Your Local Health Department Not all health departments have doctors that can see patients for sick visits, but many do, so it is worth a call to see if yours does. If you don't know where your local health department is, you can check in your phone book. The CDC also has a tool to help you locate your state's health department, and many state websites also have listings of all of their local health departments.  3) Find a [place] [facility] If your illness is not likely to be very severe or complicated, you may want to try a walk in clinic. These are popping up all over the country in pharmacies, drugstores, and shopping centers. They're usually staffed by nurse practitioners or physician assistants that have been trained to treat common illnesses and complaints. They're usually fairly quick and inexpensive. However, if you have serious medical issues or chronic medical problems, these are probably not your best option  [facility] [place], [facility], [address], [place], phone [phone] or [phone].  Monday - Friday, call for an appointment. [place], [facility], [address], [place], phone [phone] or [phone].  Monday - Friday, call for an appointment.  Abuse/Neglect: [place] [phone] [place] [phone] (After Hours)  Emergency Shelter:  [facility] [phone]  Maternity Homes: Room at the [place] [place] [phone] [place] [phone]  MRSA Hotline #:   (424)597-7219  [facility]. [address]         [place] Phone:  [phone]  Phone:  [phone]                   Phone:  [place], [place]- [phone]       -     [facility] in [place], [address],                                  [place] [phone] or [phone] (After Hours)   [place]  Substance Abuse Resources: [facility] Drug [facility]  [phone] [place] [phone] [facility] Chinita Pester [phone] Residential & Outpatient Substance Abuse Program  [phone]  Psychological Services: [place]  [phone] [place], [place]. [address] [place], [place]: [phone] or [phone], PicCapture.uy  Dental Assistance  If unable to pay or uninsured, contact:  [facility] or [facility]. to become qualified for the adult dental clinic.  Patients with Medicaid: [facility] [phone], [place] [address], [phone]  If unable to pay, or uninsured, contact [facility] [phone]) or [place] [phone] in [place], [place] in [facility]) to become qualified for the adult dental clinic   Other [place]- [place], [place], [place], [address], [place], [place], 2nd and 4th Thursday of the month  at 6:30am.  10 clients each day by appointment, can sometimes see walk-in patients if someone does not show for an appointment. [facility]- [address]ard Danker [place], [place], [address], [place], [place], [place], [address], [place] Department- [phone] [place] [facility] [phone]

## 2018-01-15 NOTE — ED Provider Notes (Signed)
[facility]-EMER[facility]ENCY DEPT Provider Note   CSN: [id number] Arrival date & time: 01/15/18  1533     History   Chief Complaint Chief Complaint  Patient presents with  . Flank Pain    HPI Benjamin Kirby is a 57 y.o. male with history of the kidney stones presenting for left flank pain.  Patient states that left flank pain began on Monday suddenly and he presented to his urologist.  Patient states that they performed an x-ray and saw a "4 cm "stone in his left urethra.  Patient states that he was given Percocet, tamsulosin, Zofran.  Patient states that his pain continued until Thursday when it suddenly stopped.  Patient states that this morning at 4 AM left flank pain suddenly restarted.  Patient describes the pain as a sharp, constant pain that is worse with movement.  Patient has not taken any medication for the pain, he states that he is out of the Percocet previously prescribed.  Patient endorses nausea however denies vomiting.  Additionally patient and his wife are concerned because he developed left testicular pain that he describes as a dull feeling and mild in intensity that began on Thursday after the flank pain ceased.  Patient states that the pain is intermittent and is only present when left flank pain increases.  Denies aggravating or alleviating factors for this pain.  Denies history of trauma to the area.  Patient denies scrotal swelling or penile discharge.  Patient denying pain at time of my evaluation.  Patient denies history of fevers, vomiting, diarrhea, abdominal pain, hematuria, dysuria, scrotal swelling or injury. HPI  Past Medical History:  Diagnosis Date  . GERD (gastroesophageal reflux disease)   . Headache    migraines   . Heart murmur    history of heart murmur, resolved now  . History of kidney stones   . PTSD (post-traumatic stress disorder)    PTSD    Patient Active Problem List   Diagnosis Date Noted  . Healthcare maintenance  06/07/2011  . RBBB 06/07/2011    Past Surgical History:  Procedure Laterality Date  . CHOLECYSTECTOMY     in the 80's  . CLEFT PALATE REPAIR     as child  . EXTRACORPOREAL SHOCK WAVE LITHOTRIPSY Right 05/02/2017   Procedure: RIGHT EXTRACORPOREAL SHOCK WAVE LITHOTRIPSY (ESWL);  Surgeon: Rene Paci, MD;  Location: [facility];  Service: Urology;  Laterality: Right;  . HERNIA REPAIR     double in the 80's  . KNEE ARTHROSCOPY     right        Home Medications    Prior to Admission medications   Medication Sig Start Date End Date Taking? Authorizing Provider  FLUoxetine (PROZAC) 10 MG capsule Take 20 mg by mouth at bedtime.    Yes [provider]  ibuprofen (ADVIL,MOTRIN) 200 MG tablet Take 800 mg by mouth every 6 (six) hours as needed for moderate pain.   Yes [provider]  omeprazole (PRILOSEC) 40 MG capsule Take 40 mg by mouth at bedtime.   Yes [provider]  prazosin (MINIPRESS) 2 MG capsule Take 2 mg by mouth at bedtime.    Yes [provider]  ropinirole (REQUIP) 5 MG tablet Take 5 mg by mouth at bedtime.   Yes [provider]  SUMAtriptan (IMITREX) 100 MG tablet Take 100 mg by mouth daily as needed for migraine.  04/06/17  Yes [provider]  traZODone (DESYREL) 100 MG tablet Take 200 mg by  mouth at bedtime.   Yes [provider]  ondansetron (ZOFRAN) 4 MG tablet Take 1 tablet (4 mg total) by mouth every 8 (eight) hours as needed for nausea or vomiting. 01/15/18   Harlene Salts A, PA-C  oxyCODONE-acetaminophen (PERCOCET/ROXICET) 5-325 MG tablet Take 1 tablet by mouth every 4 (four) hours as needed for severe pain. 01/15/18   Harlene Salts A, PA-C  tamsulosin (FLOMAX) 0.4 MG CAPS capsule Take 1 capsule (0.4 mg total) by mouth daily. 01/15/18   Bill Salinas, PA-C    Family History History reviewed. No pertinent family history.  Social History Social History   Tobacco Use  . Smoking  status: Never Smoker  . Smokeless tobacco: Never Used  Substance Use Topics  . Alcohol use: Yes    Comment: rare  . Drug use: No     Allergies   Patient has no known allergies.   Review of Systems Review of Systems  Constitutional: Negative.  Negative for chills, fatigue and fever.  Gastrointestinal: Positive for nausea. Negative for abdominal pain, diarrhea and vomiting.  Genitourinary: Positive for flank pain and testicular pain. Negative for discharge, dysuria, hematuria, penile pain and scrotal swelling.  Neurological: Negative.  Negative for dizziness, syncope, weakness, light-headedness and headaches.  All other systems reviewed and are negative.  Physical Exam Updated Vital Signs BP 125/68 (BP Location: Right Arm)   Pulse (!) 54   Temp 98 F (36.7 C) (Oral)   Resp 16   SpO2 98%   Physical Exam  Constitutional: He is oriented to person, place, and time. He appears well-developed and well-nourished. No distress.  HENT:  Head: Normocephalic and atraumatic.  Right Ear: External ear normal.  Left Ear: External ear normal.  Nose: Nose normal.  Eyes: Pupils are equal, round, and reactive to light. EOM are normal.  Neck: Trachea normal, normal range of motion, full passive range of motion without pain and phonation normal. Neck supple. No tracheal deviation present.  Cardiovascular: Normal rate, regular rhythm, normal heart sounds and intact distal pulses.  Pulmonary/Chest: Effort normal and breath sounds normal. No respiratory distress.  Abdominal: Soft. Bowel sounds are normal. There is no tenderness. There is CVA tenderness. There is no rigidity, no rebound and no guarding.  Left CVA tenderness.  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  No external genital lesions noted, no bumps on head of penis, specifically no vesicles concerning for herpes or chancre suggestive of syphilis.  No pain with palpation of the  penis/glans, no discharge or urethritis noted.  Scrotum and testicles without erythema/swelling. Patient endorses mild tenderness to palpation of left testicle. Cremasteric reflex intact bilaterally. No palpable hernia noted.   Musculoskeletal: Normal range of motion.  Neurological: He is alert and oriented to person, place, and time. He has normal strength. No cranial nerve deficit or sensory deficit. GCS eye subscore is 4. GCS verbal subscore is 5. GCS motor subscore is 6.  Skin: Skin is warm and dry.  Psychiatric: He has a normal mood and affect. His behavior is normal.     ED Treatments / Results  Labs (all labs ordered are listed, but only abnormal results are displayed) Labs Reviewed  URINALYSIS, ROUTINE W REFLEX MICROSCOPIC - Abnormal; Notable for the following components:      Result Value   Color, Urine STRAW (*)    Hgb urine dipstick MODERATE (*)    All other components within normal limits  BASIC METABOLIC PANEL - Abnormal; Notable for  the following components:   Glucose, Bld 119 (*)    Creatinine, Ser 1.65 (*)    GFR calc non Af Amer 44 (*)    GFR calc Af Amer 51 (*)    All other components within normal limits  CBC WITH DIFFERENTIAL/PLATELET    EKG None  Radiology Ct Renal Stone Study  Result Date: 01/15/2018 CLINICAL DATA:  Left flank pain EXAM: CT ABDOMEN AND PELVIS WITHOUT CONTRAST TECHNIQUE: Multidetector CT imaging of the abdomen and pelvis was performed following the standard protocol without IV contrast. COMPARISON:  CT abdomen pelvis 04/28/2017 FINDINGS: Lower chest: Normal heart size. Dependent atelectasis within the bilateral lower lobes. No pleural effusion. Hepatobiliary: Liver is normal in size and contour. Patient status post cholecystectomy. No intrahepatic or extrahepatic biliary ductal dilatation. Pancreas: Unremarkable Spleen: Unremarkable Adrenals/Urinary Tract: Stable 1.1 cm right adrenal nodule (image 21; series 2), favored to represent an adenoma  given stability over time. Normal left adrenal gland. Kidneys are symmetric in size. Mild left pelviectasis and ureterectasis to the level of the distal left ureter were there is an obstructing 5 mm stone (image 80; series 2). Urinary bladder is unremarkable. Punctate 2 mm stone inferior pole right kidney. No additional nephrolithiasis. Stomach/Bowel: No abnormal bowel wall thickening or evidence for bowel obstruction. Normal appendix. Normal morphology of the stomach. Vascular/Lymphatic: Normal caliber abdominal aorta. Peripheral calcified atherosclerotic plaque. No retroperitoneal lymphadenopathy. Reproductive: Prostate is unremarkable. Other: Small fat containing right inguinal hernia. Musculoskeletal: Stable sclerotic change about the SI joints. IMPRESSION: 1. Mildly obstructing stone distal left ureter measuring 5 mm. 2. Punctate 2 mm stone inferior pole right kidney. Electronically Signed   By: Annia Beltrew  Davis M.D.   On: 01/15/2018 18:06   [place] Scrotum Doppler  Result Date: 01/15/2018 CLINICAL DATA:  Left testicular pain. EXAM: SCROTAL ULTRASOUND DOPPLER ULTRASOUND OF THE TESTICLES TECHNIQUE: Complete ultrasound examination of the testicles, epididymis, and other scrotal structures was performed. Color and spectral Doppler ultrasound were also utilized to evaluate blood flow to the testicles. COMPARISON:  None. FINDINGS: Right testicle Measurements: 2.3 x 3.5 x 3.8 cm. No mass or microlithiasis visualized. Left testicle Measurements: 1.8 x 4.0 x 3.8 cm. No mass or microlithiasis visualized. Right epididymis:  Normal in size and appearance. Left epididymis:  3 mm cyst. Hydrocele:  Small simple right hydrocele. Varicocele:  None visualized. Pulsed Doppler interrogation of both testes demonstrates normal low resistance arterial and venous waveforms bilaterally. Normal symmetric vascularity of the epididymis bilaterally. IMPRESSION: Normal sonographic evaluation of the testicles without evidence of torsion. 3 mm  left epididymal cyst. Tiny right hydrocele. Electronically Signed   By: Elberta Fortisaniel  Boyle M.D.   On: 01/15/2018 17:25    Procedures Procedures (including critical care time)  Medications Ordered in ED Medications  ondansetron (ZOFRAN) injection 4 mg (4 mg Intravenous Given 01/15/18 1643)  morphine 4 MG/ML injection 4 mg (4 mg Intravenous Given 01/15/18 1645)     Initial Impression / Assessment and Plan / ED Course  I have reviewed the triage vital signs and the nursing notes.  Pertinent labs & imaging results that were available during my care of the patient were reviewed by me and considered in my medical decision making (see chart for details).  Clinical Course as of Jan 15 2037  Wynelle LinkSun Jan 15, 2018  1622 GU exam chaperoned by Karma Ganjaarley RN   [BM]    Clinical Course User Index [BM] Bill SalinasMorelli, Maximo Spratling A, PA-C   Pt has been diagnosed with a Kidney Stone via CT.  There is no evidence of significant hydronephrosis, serum creatine 1.65 slightly above last value discussed with Dr. Ranae Palms who advises PCP follow-up, vitals sign stable and the pt does not have irratractable vomiting.  Patient has been prescribed Percocet for pain.  Informed not to drive or operate heavy machinery while taking this medication.  Flomax to facilitate stone passage.  And Zofran for nausea.  Patient is well-appearing, no acute distress at this time resting comfortably.  Patient states that he has a appointment with his urologist on Tuesday, encouraged to follow-up with patient informed of increased creatinine and need to follow-up, patient encouraged to drink plenty of water.  No indication for antibiotics at this time.  Ultrasound of scrotum obtained due to left testicular pain.  Normal scrotal ultrasound without evidence of torsion.  It is likely that patient's pain is referred due to left-sided kidney stone.  Patient informed to follow-up with urologist regarding this as well.  At this time there does not appear to be any  evidence of an acute emergency medical condition and the patient appears stable for discharge with appropriate outpatient follow up. Diagnosis was discussed with patient who verbalizes understanding of care plan and is agreeable to discharge. I have discussed return precautions with patient and family at bedside who verbalize understanding of return precautions. Patient strongly encouraged to follow-up with their PCP and urology. All questions answered.  Patient's case discussed with Dr. Ranae Palms who agrees with plan to discharge with follow-up.     Note: Portions of this report may have been transcribed using voice recognition software. Every effort was made to ensure accuracy; however, inadvertent computerized transcription errors may still be present.    Final Clinical Impressions(s) / ED Diagnoses   Final diagnoses:  Left testicular pain  Nephrolithiasis  Elevated serum creatinine    ED Discharge Orders         Ordered    oxyCODONE-acetaminophen (PERCOCET/ROXICET) 5-325 MG tablet  Every 4 hours PRN     01/15/18 1907    tamsulosin (FLOMAX) 0.4 MG CAPS capsule  Daily     01/15/18 1907    ondansetron (ZOFRAN) 4 MG tablet  Every 8 hours PRN     01/15/18 1907           Elizabeth Palau 01/15/18 2041    Loren Racer, MD 01/15/18 2221

## 2020-04-10 IMAGING — CT CT RENAL STONE PROTOCOL
2 of 4 series · 16 of 46 positions shown, 18 images · non-contrast
Comparison: CT abdomen pelvis 04/28/2017

CLINICAL DATA: Left flank pain

EXAM:
CT ABDOMEN AND PELVIS WITHOUT CONTRAST
TECHNIQUE: Multidetector CT imaging of the abdomen and pelvis was performed
following the standard protocol without IV contrast.

[Series 2: axial st · axial · 0.80mm/px · z∈[+1179,+1614]mm · 13 of 97 slices shown, 15 images]
[im 5/97  soft-tissue]
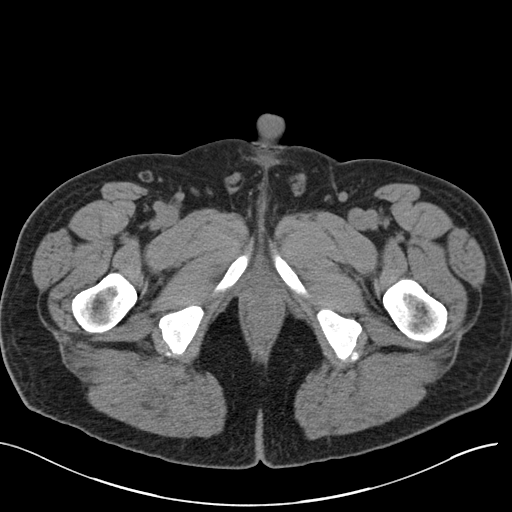
[im 5/97  bone]
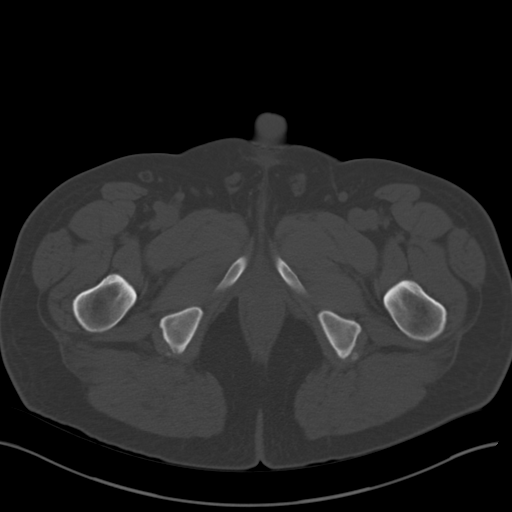
[im 14/97  soft-tissue]
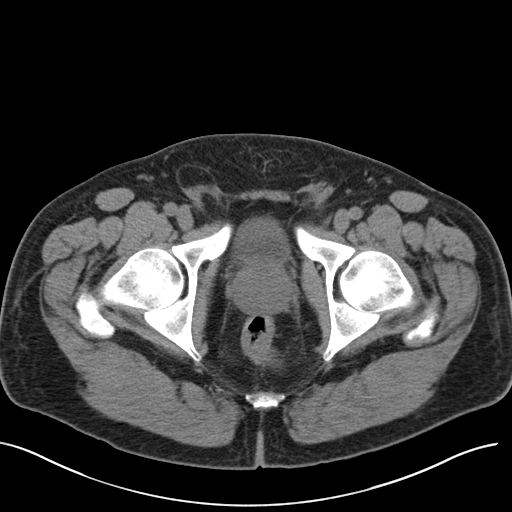
[im 19/97  soft-tissue]
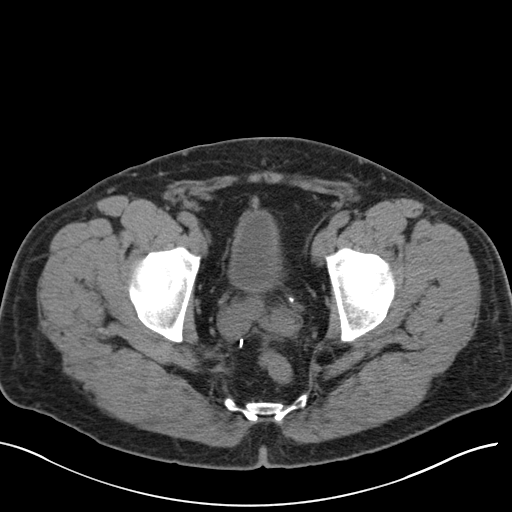
[im 28/97  soft-tissue]
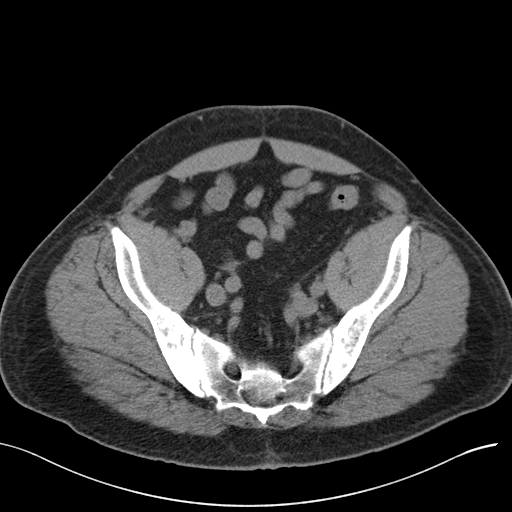
[im 33/97  soft-tissue]
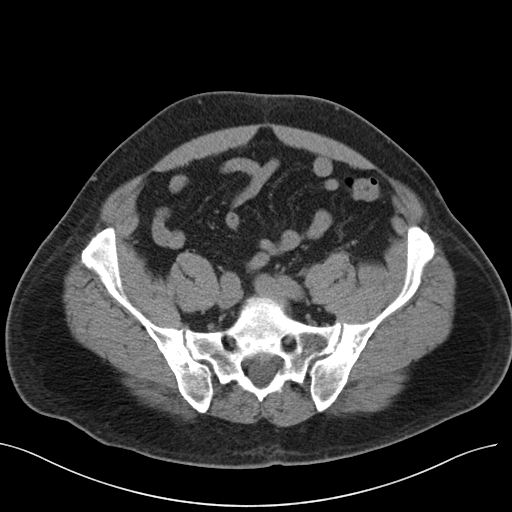
[im 42/97  soft-tissue]
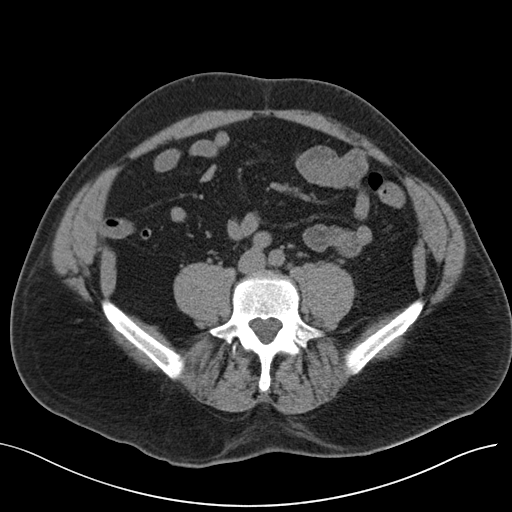
[im 51/97  soft-tissue]
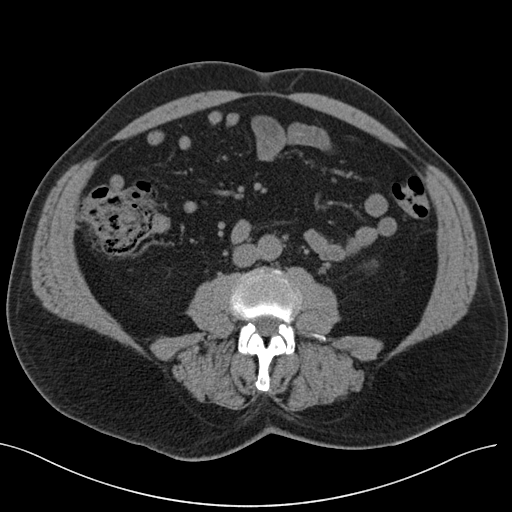
[im 55/97  soft-tissue]
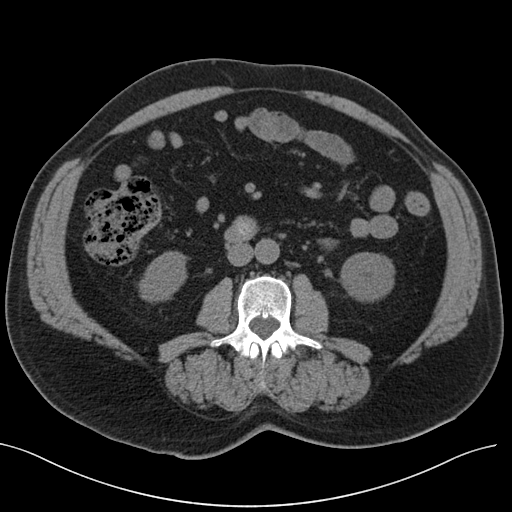
[im 65/97  soft-tissue]
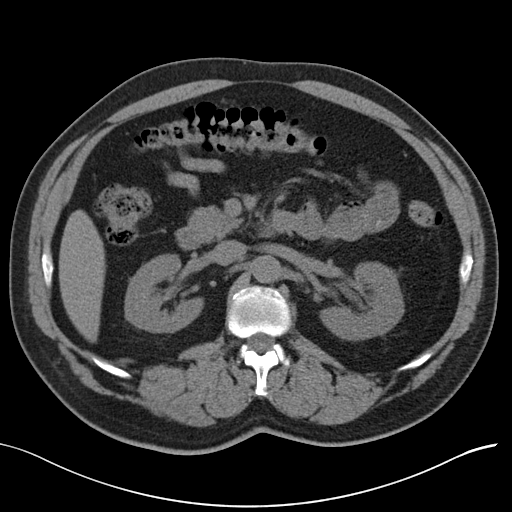
[im 65/97  bone]
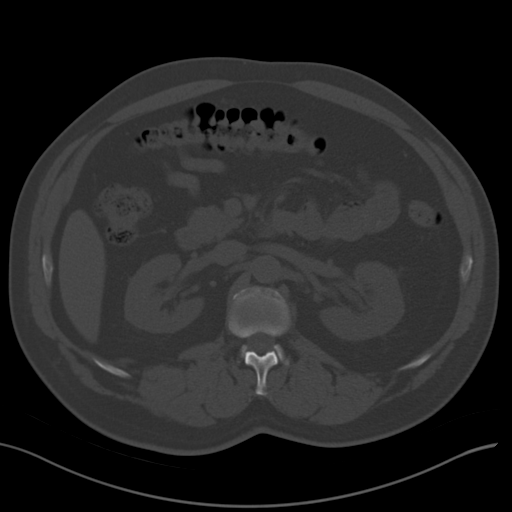
[im 69/97  soft-tissue]
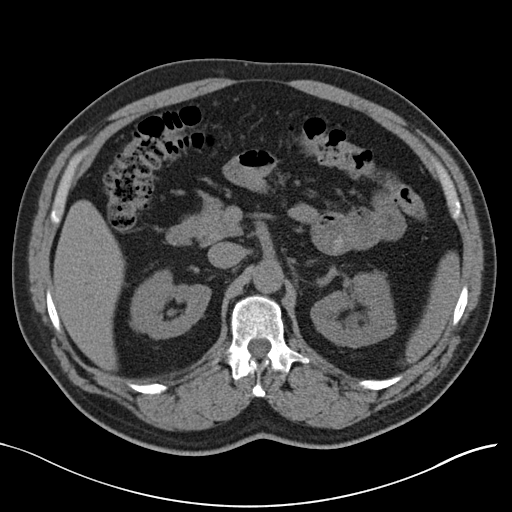
[im 78/97  soft-tissue]
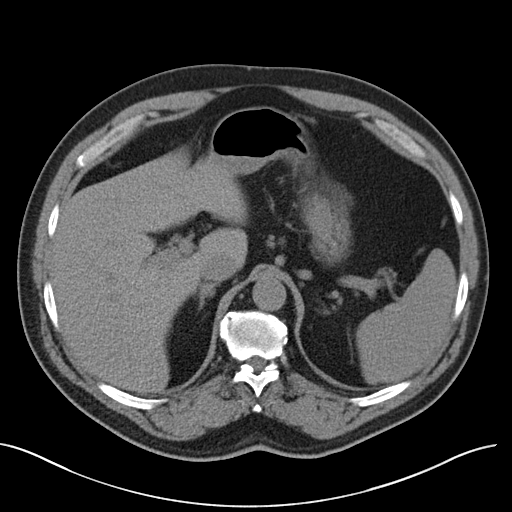
[im 83/97  soft-tissue]
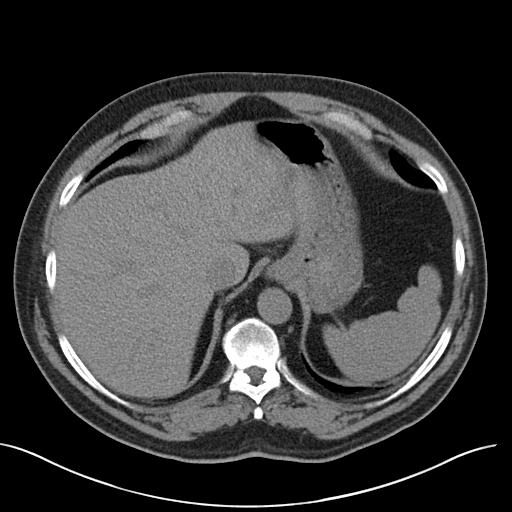
[im 92/97  soft-tissue]
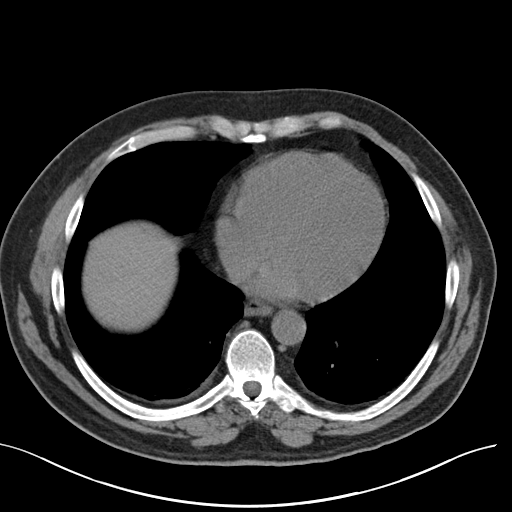

[Series 4: coronal · coronal · 0.75mm/px · 3 of 151 slices shown]
[im 51/151  soft-tissue]
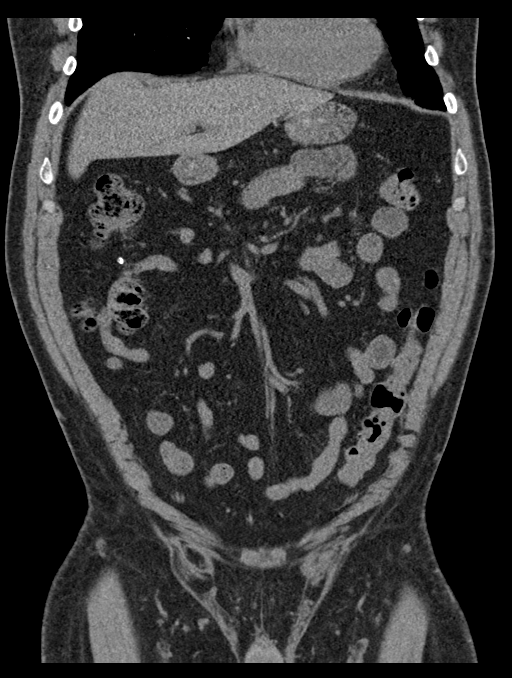
[im 67/151  soft-tissue]
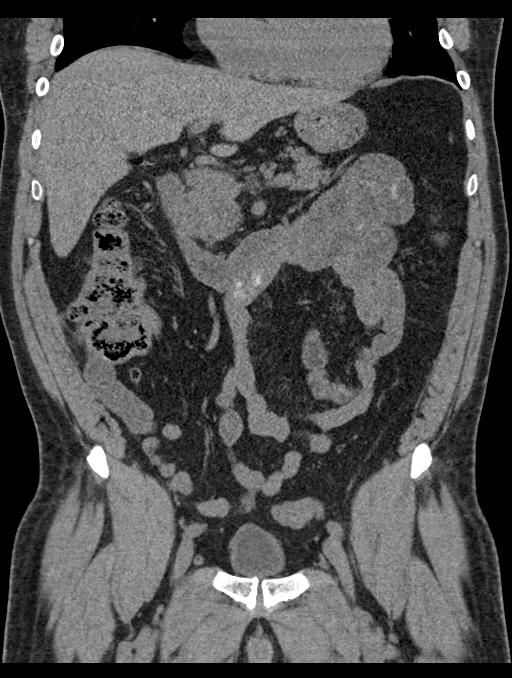
[im 84/151  soft-tissue]
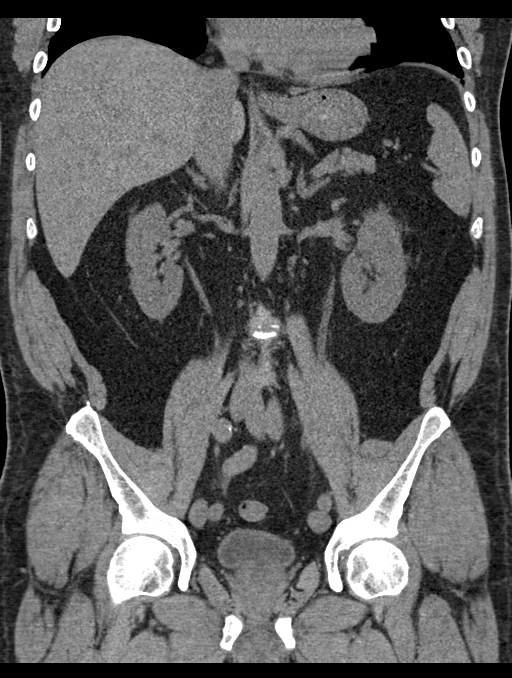

[16 of 46 positions shown; findings below may reference images not displayed]

FINDINGS: Lower chest: Normal heart size. Dependent atelectasis within the
bilateral lower lobes. No pleural effusion.

Hepatobiliary: Liver is normal in size and contour. Patient status
post cholecystectomy. No intrahepatic or extrahepatic biliary ductal
dilatation.

Pancreas: Unremarkable

Spleen: Unremarkable

Adrenals/Urinary Tract: Stable 1.1 cm right adrenal nodule (image
21; series 2), favored to represent an adenoma given stability over
time. Normal left adrenal gland. Kidneys are symmetric in size. Mild
left pelviectasis and ureterectasis to the level of the distal left
ureter were there is an obstructing 5 mm stone (image 80; series 2).
Urinary bladder is unremarkable. Punctate 2 mm stone inferior pole
right kidney. No additional nephrolithiasis.

Stomach/Bowel: No abnormal bowel wall thickening or evidence for
bowel obstruction. Normal appendix. Normal morphology of the
stomach.

Vascular/Lymphatic: Normal caliber abdominal aorta. Peripheral
calcified atherosclerotic plaque. No retroperitoneal
lymphadenopathy.

Reproductive: Prostate is unremarkable.

Other: Small fat containing right inguinal hernia.

Musculoskeletal: Stable sclerotic change about the SI joints.
IMPRESSION: 1. Mildly obstructing stone distal left ureter measuring 5 mm.
2. Punctate 2 mm stone inferior pole right kidney.
